# Patient Record
Sex: Female | Born: 1965 | Race: White | Hispanic: No | Marital: Married | Smoking: Current every day smoker
Health system: Southern US, Community
[De-identification: ages and names within clinical notes are randomized; demographics above are authoritative.]

## PROBLEM LIST (undated history)

## (undated) DIAGNOSIS — G5602 Carpal tunnel syndrome, left upper limb: Secondary | ICD-10-CM

## (undated) DIAGNOSIS — M19049 Primary osteoarthritis, unspecified hand: Secondary | ICD-10-CM

## (undated) DIAGNOSIS — F329 Major depressive disorder, single episode, unspecified: Secondary | ICD-10-CM

## (undated) DIAGNOSIS — F32A Depression, unspecified: Secondary | ICD-10-CM

## (undated) HISTORY — PX: STAPEDES SURGERY: SHX789

## (undated) HISTORY — PX: OTHER SURGICAL HISTORY: SHX169

---

## 2000-01-17 ENCOUNTER — Other Ambulatory Visit: Admission: RE | Admit: 2000-01-17 | Discharge: 2000-01-17 | Payer: Self-pay | Admitting: Internal Medicine

## 2008-05-28 ENCOUNTER — Other Ambulatory Visit: Admission: RE | Admit: 2008-05-28 | Discharge: 2008-05-28 | Payer: Self-pay | Admitting: Family Medicine

## 2009-03-24 ENCOUNTER — Encounter: Admission: RE | Admit: 2009-03-24 | Discharge: 2009-03-24 | Payer: Self-pay | Admitting: Family Medicine

## 2009-06-26 ENCOUNTER — Encounter: Admission: RE | Admit: 2009-06-26 | Discharge: 2009-06-26 | Payer: Self-pay | Admitting: Family Medicine

## 2009-08-15 HISTORY — PX: CARPAL TUNNEL RELEASE: SHX101

## 2009-12-06 ENCOUNTER — Emergency Department (HOSPITAL_BASED_OUTPATIENT_CLINIC_OR_DEPARTMENT_OTHER): Admission: EM | Admit: 2009-12-06 | Discharge: 2009-12-06 | Payer: Self-pay | Admitting: Emergency Medicine

## 2009-12-06 ENCOUNTER — Ambulatory Visit: Payer: Self-pay | Admitting: Interventional Radiology

## 2010-11-02 LAB — COMPREHENSIVE METABOLIC PANEL
ALT: 19 U/L (ref 0–35)
Calcium: 9.7 mg/dL (ref 8.4–10.5)
Chloride: 105 mEq/L (ref 96–112)
Glucose, Bld: 127 mg/dL — ABNORMAL HIGH (ref 70–99)
Sodium: 140 mEq/L (ref 135–145)

## 2010-11-02 LAB — CBC
MCHC: 34 g/dL (ref 30.0–36.0)
Platelets: 186 10*3/uL (ref 150–400)

## 2010-11-02 LAB — DIFFERENTIAL
Basophils Absolute: 0 10*3/uL (ref 0.0–0.1)
Basophils Relative: 0 % (ref 0–1)
Eosinophils Relative: 0 % (ref 0–5)
Lymphocytes Relative: 4 % — ABNORMAL LOW (ref 12–46)
Lymphs Abs: 0.7 10*3/uL (ref 0.7–4.0)
Monocytes Absolute: 1 10*3/uL (ref 0.1–1.0)
Neutro Abs: 14.6 10*3/uL — ABNORMAL HIGH (ref 1.7–7.7)
Neutrophils Relative %: 90 % — ABNORMAL HIGH (ref 43–77)

## 2010-11-02 LAB — LIPASE, BLOOD: Lipase: 24 U/L (ref 23–300)

## 2010-11-02 LAB — URINALYSIS, ROUTINE W REFLEX MICROSCOPIC
Bilirubin Urine: NEGATIVE
Ketones, ur: NEGATIVE mg/dL
Nitrite: NEGATIVE
Specific Gravity, Urine: 1.021 (ref 1.005–1.030)

## 2011-07-16 HISTORY — PX: FINGER ARTHROPLASTY: SHX5017

## 2011-11-07 ENCOUNTER — Emergency Department (HOSPITAL_BASED_OUTPATIENT_CLINIC_OR_DEPARTMENT_OTHER)
Admission: EM | Admit: 2011-11-07 | Discharge: 2011-11-07 | Disposition: A | Discharge status: Home / Self Care | Payer: BC Managed Care – PPO | Attending: Emergency Medicine | Admitting: Emergency Medicine

## 2011-11-07 ENCOUNTER — Encounter (HOSPITAL_BASED_OUTPATIENT_CLINIC_OR_DEPARTMENT_OTHER): Payer: Self-pay | Admitting: *Deleted

## 2011-11-07 ENCOUNTER — Emergency Department (INDEPENDENT_AMBULATORY_CARE_PROVIDER_SITE_OTHER): Payer: BC Managed Care – PPO

## 2011-11-07 DIAGNOSIS — S139XXA Sprain of joints and ligaments of unspecified parts of neck, initial encounter: Secondary | ICD-10-CM | POA: Insufficient documentation

## 2011-11-07 DIAGNOSIS — M542 Cervicalgia: Secondary | ICD-10-CM

## 2011-11-07 DIAGNOSIS — F17200 Nicotine dependence, unspecified, uncomplicated: Secondary | ICD-10-CM | POA: Insufficient documentation

## 2011-11-07 DIAGNOSIS — M25519 Pain in unspecified shoulder: Secondary | ICD-10-CM

## 2011-11-07 DIAGNOSIS — S20229A Contusion of unspecified back wall of thorax, initial encounter: Secondary | ICD-10-CM | POA: Insufficient documentation

## 2011-11-07 DIAGNOSIS — M549 Dorsalgia, unspecified: Secondary | ICD-10-CM | POA: Insufficient documentation

## 2011-11-07 DIAGNOSIS — S161XXA Strain of muscle, fascia and tendon at neck level, initial encounter: Secondary | ICD-10-CM

## 2011-11-07 HISTORY — DX: Major depressive disorder, single episode, unspecified: F32.9

## 2011-11-07 HISTORY — DX: Depression, unspecified: F32.A

## 2011-11-07 MED ORDER — CYCLOBENZAPRINE HCL 10 MG PO TABS: 10.0000 mg | ORAL_TABLET | Freq: Two times a day (BID) | ORAL | Status: AC | PRN

## 2011-11-07 MED ORDER — HYDROCODONE-ACETAMINOPHEN 5-325 MG PO TABS: 1.0000 | ORAL_TABLET | ORAL | Status: AC | PRN

## 2011-11-07 NOTE — Discharge Instructions (Signed)
Cervical Strain Care After A cervical strain is when the muscles and ligaments in your neck have been stretched. The bones are not broken. If you had any problems moving your arms or legs immediately after the injury, even if the problem has gone away, make sure to tell this to your caregiver.  HOME CARE INSTRUCTIONS   While awake, apply ice packs to the neck or areas of pain about every 1 to 2 hours, for 15 to 20 minutes at a time. Do this for 2 days. If you were given a cervical collar for support, ask your caregiver if you may remove it for bathing or applying ice.   If given a cervical collar, wear as instructed. Do not remove any collar unless instructed by a caregiver.   Only take over-the-counter or prescription medicines for pain, discomfort, or fever as directed by your caregiver.  Recheck with the hospital or clinic after a radiologist has read your X-rays. Recheck with the hospital or clinic to make sure the initial readings are correct. Do this also to determine if you need further studies. It is your responsibility to find out your X-ray results. X-rays are sometimes repeated in one week to ten days. These are often repeated to make sure that a hairline fracture was not overlooked. Ask your caregiver how you are to find out about your radiology (X-ray) results. SEEK IMMEDIATE MEDICAL CARE IF:   You have increasing pain in your neck.   You develop difficulties swallowing or breathing.   You have numbness, weakness, or movement problems in the arms or legs.   You have difficulty walking.   You develop bowel or bladder retention or incontinence.   You have problems with walking.  MAKE SURE YOU:   Understand these instructions.   Will watch your condition.   Will get help right away if you are not doing well or get worse.  Document Released: 08/01/2005 Document Revised: 04/13/2011 Document Reviewed: 03/14/2008 [facility] Patient Information 2012 [place], [place].  Contusion A  contusion is a deep bruise. Contusions are the result of an injury that caused bleeding under the skin. The contusion may turn blue, purple, or yellow. Minor injuries will give you a painless contusion, but more severe contusions may stay painful and swollen for a few weeks.  CAUSES  A contusion is usually caused by a blow, trauma, or direct force to an area of the body. SYMPTOMS   Swelling and redness of the injured area.   Bruising of the injured area.   Tenderness and soreness of the injured area.   Pain.  DIAGNOSIS  The diagnosis can be made by taking a history and physical exam. An X-ray, CT scan, or MRI may be needed to determine if there were any associated injuries, such as fractures. TREATMENT  Specific treatment will depend on what area of the body was injured. In general, the best treatment for a contusion is resting, icing, elevating, and applying cold compresses to the injured area. Over-the-counter medicines may also be recommended for pain control. Ask your caregiver what the best treatment is for your contusion. HOME CARE INSTRUCTIONS   Put ice on the injured area.   Put ice in a plastic bag.   Place a towel between your skin and the bag.   Leave the ice on for 15 to 20 minutes, 3 to 4 times a day.   Only take over-the-counter or prescription medicines for pain, discomfort, or fever as directed by your caregiver. Your caregiver may  recommend avoiding anti-inflammatory medicines (aspirin, ibuprofen, and naproxen) for 48 hours because these medicines may increase bruising.   Rest the injured area.   If possible, elevate the injured area to reduce swelling.  SEEK IMMEDIATE MEDICAL CARE IF:   You have increased bruising or swelling.   You have pain that is getting worse.   Your swelling or pain is not relieved with medicines.  MAKE SURE YOU:   Understand these instructions.   Will watch your condition.   Will get help right away if you are not doing well or get  worse.  Document Released: 05/11/2005 Document Revised: 07/21/2011 Document Reviewed: 06/06/2011 [facility] Patient Information 2012 [employer].
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
## 2012-06-27 ENCOUNTER — Encounter (HOSPITAL_BASED_OUTPATIENT_CLINIC_OR_DEPARTMENT_OTHER): Payer: Self-pay | Admitting: *Deleted

## 2012-06-27 NOTE — Progress Notes (Signed)
NPO AFTER MN. ARRIVES AT 0645. NEEDS HG AND URINE PREG.

## 2012-07-02 ENCOUNTER — Encounter (HOSPITAL_BASED_OUTPATIENT_CLINIC_OR_DEPARTMENT_OTHER): Payer: Self-pay

## 2012-07-02 ENCOUNTER — Ambulatory Visit (HOSPITAL_BASED_OUTPATIENT_CLINIC_OR_DEPARTMENT_OTHER): Admit: 2012-07-02 | Payer: Self-pay | Admitting: Orthopedic Surgery

## 2012-07-02 ENCOUNTER — Encounter (HOSPITAL_BASED_OUTPATIENT_CLINIC_OR_DEPARTMENT_OTHER)
Admission: RE | Disposition: A | Discharge status: Home / Self Care | Payer: Self-pay | Source: Ambulatory Visit | Attending: Orthopedic Surgery

## 2012-07-02 ENCOUNTER — Encounter (HOSPITAL_BASED_OUTPATIENT_CLINIC_OR_DEPARTMENT_OTHER): Payer: Self-pay | Admitting: Anesthesiology

## 2012-07-02 ENCOUNTER — Ambulatory Visit (HOSPITAL_BASED_OUTPATIENT_CLINIC_OR_DEPARTMENT_OTHER): Payer: BC Managed Care – PPO | Admitting: Anesthesiology

## 2012-07-02 ENCOUNTER — Ambulatory Visit (HOSPITAL_BASED_OUTPATIENT_CLINIC_OR_DEPARTMENT_OTHER)
Admission: RE | Admit: 2012-07-02 | Discharge: 2012-07-02 | Disposition: A | Discharge status: Home / Self Care | Payer: BC Managed Care – PPO | Source: Ambulatory Visit | Attending: Orthopedic Surgery | Admitting: Orthopedic Surgery

## 2012-07-02 ENCOUNTER — Encounter (HOSPITAL_BASED_OUTPATIENT_CLINIC_OR_DEPARTMENT_OTHER): Payer: Self-pay | Admitting: *Deleted

## 2012-07-02 DIAGNOSIS — F3289 Other specified depressive episodes: Secondary | ICD-10-CM | POA: Insufficient documentation

## 2012-07-02 DIAGNOSIS — G5600 Carpal tunnel syndrome, unspecified upper limb: Secondary | ICD-10-CM | POA: Insufficient documentation

## 2012-07-02 DIAGNOSIS — Z79899 Other long term (current) drug therapy: Secondary | ICD-10-CM | POA: Insufficient documentation

## 2012-07-02 DIAGNOSIS — G5630 Lesion of radial nerve, unspecified upper limb: Secondary | ICD-10-CM | POA: Insufficient documentation

## 2012-07-02 DIAGNOSIS — F329 Major depressive disorder, single episode, unspecified: Secondary | ICD-10-CM | POA: Insufficient documentation

## 2012-07-02 DIAGNOSIS — M19049 Primary osteoarthritis, unspecified hand: Secondary | ICD-10-CM | POA: Insufficient documentation

## 2012-07-02 HISTORY — DX: Carpal tunnel syndrome, left upper limb: G56.02

## 2012-07-02 HISTORY — DX: Primary osteoarthritis, unspecified hand: M19.049

## 2012-07-02 HISTORY — PX: FINGER ARTHROPLASTY: SHX5017

## 2012-07-02 HISTORY — PX: CARPAL TUNNEL RELEASE: SHX101

## 2012-07-02 LAB — POCT PREGNANCY, URINE: Preg Test, Ur: NEGATIVE

## 2012-07-02 SURGERY — ARTHROPLASTY, FINGER
Anesthesia: General | Laterality: Left

## 2012-07-02 SURGERY — ARTHROPLASTY, FINGER
Anesthesia: General | Site: Thumb | Laterality: Left | Wound class: Clean

## 2012-07-02 MED ORDER — MIDAZOLAM HCL 5 MG/5ML IJ SOLN
INTRAMUSCULAR | Status: DC | PRN
  Administered 2012-07-02 (×2): 1 mg via INTRAVENOUS

## 2012-07-02 MED ORDER — CEFAZOLIN SODIUM-DEXTROSE 2-3 GM-% IV SOLR
2.0000 g | INTRAVENOUS | Status: AC
  Administered 2012-07-02: 2 g via INTRAVENOUS

## 2012-07-02 MED ORDER — PROMETHAZINE HCL 25 MG/ML IJ SOLN
6.2500 mg | INTRAMUSCULAR | Status: DC | PRN
  Administered 2012-07-02: 6.25 mg via INTRAVENOUS

## 2012-07-02 MED ORDER — BUPIVACAINE-EPINEPHRINE 0.5% -1:200000 IJ SOLN
INTRAMUSCULAR | Status: DC | PRN
  Administered 2012-07-02: 20 mL

## 2012-07-02 MED ORDER — HYDROMORPHONE HCL PF 1 MG/ML IJ SOLN
0.5000 mg | INTRAMUSCULAR | Status: DC | PRN
  Filled 2012-07-02: qty 1

## 2012-07-02 MED ORDER — PROPOFOL 10 MG/ML IV BOLUS
INTRAVENOUS | Status: DC | PRN
  Administered 2012-07-02: 100 mg via INTRAVENOUS
  Administered 2012-07-02: 200 mg via INTRAVENOUS

## 2012-07-02 MED ORDER — LACTATED RINGERS IV SOLN
INTRAVENOUS | Status: DC
  Administered 2012-07-02 (×2): via INTRAVENOUS
  Filled 2012-07-02: qty 1000

## 2012-07-02 MED ORDER — OXYCODONE-ACETAMINOPHEN 5-325 MG PO TABS
1.0000 | ORAL_TABLET | ORAL | Status: DC | PRN
  Administered 2012-07-02: 1 via ORAL
  Filled 2012-07-02: qty 2

## 2012-07-02 MED ORDER — HYDROCODONE-ACETAMINOPHEN 5-325 MG PO TABS
1.0000 | ORAL_TABLET | ORAL | Status: DC | PRN
  Filled 2012-07-02: qty 2

## 2012-07-02 MED ORDER — LIDOCAINE HCL (CARDIAC) 20 MG/ML IV SOLN
INTRAVENOUS | Status: DC | PRN
  Administered 2012-07-02: 80 mg via INTRAVENOUS

## 2012-07-02 MED ORDER — FENTANYL CITRATE 0.05 MG/ML IJ SOLN
INTRAMUSCULAR | Status: DC | PRN
  Administered 2012-07-02: 50 ug via INTRAVENOUS
  Administered 2012-07-02: 25 ug via INTRAVENOUS
  Administered 2012-07-02 (×5): 50 ug via INTRAVENOUS
  Administered 2012-07-02: 25 ug via INTRAVENOUS
  Administered 2012-07-02: 50 ug via INTRAVENOUS

## 2012-07-02 MED ORDER — LACTATED RINGERS IV SOLN: INTRAVENOUS | Status: DC

## 2012-07-02 MED ORDER — ACETAMINOPHEN 10 MG/ML IV SOLN
INTRAVENOUS | Status: DC | PRN
  Administered 2012-07-02: 1000 mg via INTRAVENOUS

## 2012-07-02 MED ORDER — KETOROLAC TROMETHAMINE 30 MG/ML IJ SOLN
INTRAMUSCULAR | Status: DC | PRN
  Administered 2012-07-02: 30 mg via INTRAVENOUS

## 2012-07-02 MED ORDER — DEXAMETHASONE SODIUM PHOSPHATE 4 MG/ML IJ SOLN
INTRAMUSCULAR | Status: DC | PRN
  Administered 2012-07-02: 10 mg via INTRAVENOUS

## 2012-07-02 MED ORDER — HYDROMORPHONE HCL PF 1 MG/ML IJ SOLN
0.2500 mg | INTRAMUSCULAR | Status: DC | PRN
  Administered 2012-07-02 (×2): 0.5 mg via INTRAVENOUS

## 2012-07-02 MED ORDER — ONDANSETRON HCL 4 MG/2ML IJ SOLN
INTRAMUSCULAR | Status: DC | PRN
  Administered 2012-07-02: 4 mg via INTRAVENOUS

## 2012-07-02 SURGICAL SUPPLY — 72 items
BANDAGE GAUZE ELAST BULKY 4 IN (GAUZE/BANDAGES/DRESSINGS) ×6 IMPLANT
BLADE AVERAGE 25X9 (BLADE) ×3 IMPLANT
BLADE MINI RND TIP GREEN BEAV (BLADE) ×6 IMPLANT
BLADE SURG 15 STRL LF DISP TIS (BLADE) ×2 IMPLANT
BLADE SURG 15 STRL SS (BLADE) ×1
BNDG COHESIVE 3X5 TAN STRL LF (GAUZE/BANDAGES/DRESSINGS) IMPLANT
BNDG COHESIVE 4X5 TAN NS LF (GAUZE/BANDAGES/DRESSINGS) ×3 IMPLANT
BNDG ESMARK 4X9 LF (GAUZE/BANDAGES/DRESSINGS) ×3 IMPLANT
CANISTER SUCTION 1200CC (MISCELLANEOUS) IMPLANT
CANISTER SUCTION 2500CC (MISCELLANEOUS) IMPLANT
CHLORAPREP W/TINT 26ML (MISCELLANEOUS) ×3 IMPLANT
CLOTH BEACON ORANGE TIMEOUT ST (SAFETY) ×3 IMPLANT
CORDS BIPOLAR (ELECTRODE) ×3 IMPLANT
COVER MAYO STAND STRL (DRAPES) IMPLANT
COVER TABLE BACK 60X90 (DRAPES) ×3 IMPLANT
DRAPE EXTREMITY T 121X128X90 (DRAPE) ×3 IMPLANT
DRAPE SURG 17X23 STRL (DRAPES) ×3 IMPLANT
DRSG EMULSION OIL 3X3 NADH (GAUZE/BANDAGES/DRESSINGS) ×3 IMPLANT
ELECT NEEDLE BLADE 2-5/6 (NEEDLE) IMPLANT
GAUZE SPONGE 4X4 12PLY STRL LF (GAUZE/BANDAGES/DRESSINGS) ×3 IMPLANT
GLOVE BIO SURGEON STRL SZ 6.5 (GLOVE) ×3 IMPLANT
GLOVE BIO SURGEON STRL SZ7.5 (GLOVE) ×3 IMPLANT
GLOVE BIOGEL PI IND STRL 8 (GLOVE) ×2 IMPLANT
GLOVE BIOGEL PI INDICATOR 8 (GLOVE) ×1
GLOVE ECLIPSE 6.0 STRL STRAW (GLOVE) ×3 IMPLANT
GLOVE INDICATOR 8.0 STRL GRN (GLOVE) ×6 IMPLANT
GOWN PREVENTION PLUS LG XLONG (DISPOSABLE) ×3 IMPLANT
GOWN PREVENTION PLUS XLARGE (GOWN DISPOSABLE) ×3 IMPLANT
INSTRUMENT SOFT TISSUE RELEASE (INSTRUMENTS) ×3 IMPLANT
KWIRE 4.0 X .062IN (WIRE) ×3 IMPLANT
LOOP VESSEL MAXI BLUE (MISCELLANEOUS) IMPLANT
LOOP VESSEL MINI RED (MISCELLANEOUS) IMPLANT
NEEDLE HYPO 22GX1.5 SAFETY (NEEDLE) IMPLANT
NEEDLE HYPO 25X1 1.5 SAFETY (NEEDLE) IMPLANT
NS IRRIG 500ML POUR BTL (IV SOLUTION) ×3 IMPLANT
PACK BASIN DAY SURGERY FS (CUSTOM PROCEDURE TRAY) ×3 IMPLANT
PAD CAST 3X4 CTTN HI CHSV (CAST SUPPLIES) ×2 IMPLANT
PAD CAST 4YDX4 CTTN HI CHSV (CAST SUPPLIES) ×2 IMPLANT
PADDING CAST ABS 3INX4YD NS (CAST SUPPLIES)
PADDING CAST ABS 4INX4YD NS (CAST SUPPLIES) ×1
PADDING CAST ABS COTTON 3X4 (CAST SUPPLIES) IMPLANT
PADDING CAST ABS COTTON 4X4 ST (CAST SUPPLIES) ×2 IMPLANT
PADDING CAST COTTON 3X4 STRL (CAST SUPPLIES) ×1
PADDING CAST COTTON 4X4 STRL (CAST SUPPLIES) ×1
SLEEVE SCD COMPRESS KNEE MED (MISCELLANEOUS) IMPLANT
SLING ULTRA II MEDIUM (SOFTGOODS) ×3 IMPLANT
SPLINT FAST PLASTER 5X30 (CAST SUPPLIES) ×1
SPLINT PLASTER CAST FAST 5X30 (CAST SUPPLIES) ×2 IMPLANT
SPLINT PLASTER CAST XFAST 3X15 (CAST SUPPLIES) IMPLANT
SPLINT PLASTER CAST XFAST 4X15 (CAST SUPPLIES) IMPLANT
SPLINT PLASTER XTRA FAST SET 4 (CAST SUPPLIES)
SPLINT PLASTER XTRA FASTSET 3X (CAST SUPPLIES)
SPONGE GAUZE 4X4 12PLY (GAUZE/BANDAGES/DRESSINGS) ×3 IMPLANT
SPONGE LAP 4X18 X RAY DECT (DISPOSABLE) ×6 IMPLANT
STOCKINETTE 4X48 STRL (DRAPES) ×3 IMPLANT
STRIP CLOSURE SKIN 1/2X4 (GAUZE/BANDAGES/DRESSINGS) ×3 IMPLANT
SUT ETHIBOND 3-0 V-5 (SUTURE) ×3 IMPLANT
SUT MERSILENE 4 0 P 3 (SUTURE) IMPLANT
SUT PDS AB 2-0 CT2 27 (SUTURE) IMPLANT
SUT STEEL 4 0 (SUTURE) IMPLANT
SUT VIC AB 2-0 PS2 27 (SUTURE) IMPLANT
SUT VICRYL 4-0 PS2 18IN ABS (SUTURE) IMPLANT
SUT VICRYL RAPID 5 0 P 3 (SUTURE) IMPLANT
SUT VICRYL RAPIDE 4/0 PS 2 (SUTURE) ×6 IMPLANT
SYR BULB 3OZ (MISCELLANEOUS) IMPLANT
SYR CONTROL 10ML LL (SYRINGE) IMPLANT
SYRINGE 10CC LL (SYRINGE) ×3 IMPLANT
TOWEL OR 17X24 6PK STRL BLUE (TOWEL DISPOSABLE) ×3 IMPLANT
TOWEL OR NON WOVEN STRL DISP B (DISPOSABLE) ×3 IMPLANT
TUBE CONNECTING 12X1/4 (SUCTIONS) IMPLANT
UNDERPAD 30X30 INCONTINENT (UNDERPADS AND DIAPERS) ×3 IMPLANT
WATER STERILE IRR 500ML POUR (IV SOLUTION) ×3 IMPLANT

## 2012-07-02 NOTE — H&P (Signed)
Kelly Marshall is an 46 y.o. female.   Chief Complaint: Left forearm/hand pain and numbness  HPI: This patient has previously undergone similar R sided procedures, and now has significant symptoms on the Left side.  She has responded transiently to non-operative measures, but would like to proceed with more surgery for more enduring relief of symptoms.  She has had NCS indicating CTS on the left  Past Medical History  Diagnosis Date  . Depression   . OA (osteoarthritis) of finger LEFT THUMB  . Carpal tunnel syndrome of left wrist     Past Surgical History  Procedure Date  . Arm surgery   . Carpal tunnel release JAN 2011    RIGHT  . Finger arthroplasty DEC 2012    RIGHT THUMB  . Stamin   . Stamen   . Staminectomy   . Stapedes surgery 1990  (APPROX)    BILATERAL    History reviewed. No pertinent family history. Social History:  reports that she has been smoking Cigarettes.  She has a 5 pack-year smoking history. She has never used smokeless tobacco. She reports that she does not drink alcohol or use illicit drugs.  Allergies: No Known Allergies  Medications Prior to Admission  Medication Sig Dispense Refill  . Citalopram Hydrobromide (CELEXA PO) Take 40 mg by mouth daily.       . Oxycodone-Acetaminophen (PERCOCET PO) Take 5-325 mg by mouth as needed.         No results found for this or any previous visit (from the past 48 hour(s)). No results found.  Review of Systems  All other systems reviewed and are negative.    Blood pressure 121/79, pulse 85, temperature 98.1 F (36.7 C), temperature source Oral, resp. rate 18, height 5\' 6"  (1.676 m), weight 67.728 kg (149 lb 5 oz), last menstrual period 05/16/2012, SpO2 98.00%. Physical Exam  Constitutional: She is oriented to person, place, and time. She appears well-developed and well-nourished.  HENT:  Head: Normocephalic and atraumatic.  Eyes: Conjunctivae normal and EOM are normal.  Neck: Normal range of motion.    Cardiovascular: Intact distal pulses.   Respiratory: Effort normal.  GI: Soft.  Neurological: She is alert and oriented to person, place, and time.  Skin: Skin is warm and dry.  Psychiatric: She has a normal mood and affect. Her behavior is normal. Judgment and thought content normal.    Left thumb very tender at [facility] joint, increased pain with TMC stress/grind.  TTP over radial nerve at radial tunnel, but not at lateral epicondyle.  Positive CCT/Phalens.  Assessment/Plan L TMC OA, CTS, Radial tunnel syndrome  Surgery as described as outpt at [facility].  Reviewed G/R/B.  Consent obtained.  Anum Palecek A. 07/02/2012, 8:49 AM

## 2012-07-02 NOTE — Anesthesia Preprocedure Evaluation (Addendum)
Anesthesia Evaluation  Patient identified by MRN, date of birth, ID band Patient awake    Reviewed: Allergy & Precautions, H&P , NPO status , Patient's Chart, lab work & pertinent test results  Airway       Dental   Pulmonary Current Smoker,          Cardiovascular negative cardio ROS      Neuro/Psych Depression  Neuromuscular disease negative psych ROS   GI/Hepatic negative GI ROS, Neg liver ROS,   Endo/Other  negative endocrine ROS  Renal/GU negative Renal ROS  negative genitourinary   Musculoskeletal negative musculoskeletal ROS (+)   Abdominal   Peds  Hematology negative hematology ROS (+)   Anesthesia Other Findings   Reproductive/Obstetrics negative OB ROS                           Anesthesia Physical Anesthesia Plan  ASA: II  Anesthesia Plan:    Post-op Pain Management:    Induction:   Airway Management Planned:   Additional Equipment:   Intra-op Plan:   Post-operative Plan:   Informed Consent:   Plan Discussed with:   Anesthesia Plan Comments:         Anesthesia Quick Evaluation

## 2012-07-02 NOTE — Transfer of Care (Signed)
Immediate Anesthesia Transfer of Care Note  Patient: Kelly Marshall  Procedure(s) Performed: Procedure(s) (LRB): FINGER ARTHROPLASTY (Left) CARPAL TUNNEL RELEASE ENDOSCOPIC (Left)  Patient Location: PACU  Anesthesia Type: General  Level of Consciousness: awake, alert  and oriented  Airway & Oxygen Therapy: Patient Spontanous Breathing and Patient connected to face mask oxygen  Post-op Assessment: Report given to PACU RN and Post -op Vital signs reviewed and stable  Post vital signs: Reviewed and stable  Complications: No apparent anesthesia complications

## 2012-07-02 NOTE — Op Note (Signed)
07/02/2012  11:22 AM  PATIENT:  Kelly Marshall  46 y.o. female  PRE-OPERATIVE DIAGNOSIS:  left thumb TMC Osteoarthritis/Carpal tunnel Syndrome/Radial Tunnel Syndrome  POST-OPERATIVE DIAGNOSIS:  Same  PROCEDURE: Left thumb suspension arthroplasty (LRTI), ECTR, Radial Neuroplasty  SURGEON:  Surgeon(s): Jodi Marble, MD  PHYSICIAN ASSISTANT: None  ANESTHESIA:  General  SPECIMENS:  None  DRAINS:   None  PREOPERATIVE INDICATIONS:  [name] is a  46 y.o. female with a diagnosis of left thumb TMC Osteoarthritis/Carpal tunnel Syndrome/Radial Tunnel Syndrome who failed conservative measures and elected for surgical management.    The risks benefits and alternatives were discussed with the patient preoperatively including but not limited to the risks of infection, bleeding, nerve injury, cardiopulmonary complications, the need for revision surgery, among others, and the patient verbalized understanding and consented to proceed.  OPERATIVE IMPLANTS: None  OPERATIVE FINDINGS: Tight carpal tunnel, acceptably decompressed following release; point of compression of radial nerve appeared to be at leading edge of supinator; grade 4 DJD of TMCJ  OPERATIVE PROCEDURE:  After receiving prophylactic antibiotics, the patient was escorted to the operative theatre and placed in a supine position.  A surgical "time-out" was performed during which the planned procedure, proposed operative site, and the correct patient identity were compared to the operative consent and agreement confirmed by the circulating nurse according to current facility policy.  Following application of a tourniquet to the operative extremity, the planned incision was marked and anesthetized with a mixture of lidocaine & bupivicaine containing epinephrine.  The exposed skin was prepped with Chloraprep and draped in the usual sterile fashion.  The limb was exsanguinated with an Esmarch bandage and the tourniquet  inflated to approximately higher than systolic BP.   The incision was made sharply with a  scalpel.  Subcutaneous tissues were dissected with blunt spreading dissection. The deep forearm fascia was then identified and split transversely to reveal the underlying median nerve. The distal edge of the fascia was grasped with a hemostat. The canal was then sequentially dilated and the undersurface of the transverse carpal ligament cleaned free of synovium. The centerline system from [employer] was then used to evaluate and divide the transverse carpal ligament beginning at the distal edge working proximally. The adequacy of the release was then judged with direct visualization using loupe assisted magnification. The deep forearm fascia was then split for a couple inches proximal to the proximal incision using a sliding scissor technique.   Attention was then directed to performing the radial neuroplasty. A slightly oblique incision 4-5 inches in length was made over the radial tunnel sharply with a scalpel. Subcutaneous tissues were dissected with blunt spreading dissection. The interval between the brachial radialis and ECRL was exploited and blunt digital dissection revealed the radial nerve. It was carefully decompressed to include both superficial and deep branch. The deep branch was found to enter the supinator and at that point was narrowed and irritable to manipulation. Just proximal to it was slightly enlarged. It was followed into the supinator for about an inch and a half and then no further portion of the supinator was split. Attention was then directed to the thumb suspension arthroplasty.  A Wagner incision was marked at the thumb as was an oblique incision over the FCR just distal to the muscle tendon junction. TMC joint was then made sharply with a scalpel. Subcutaneous tissues were dissected with blunt, spreading dissection with care to identify and protect and preserve the cutaneous nerve  branches  that were retracted and the dorsal flap. The thenar muscles were reflected extra periosteally and then the longitudinal deep incision was made at the palmar edge of the abductor tendon, allowing some of the volar portion to go with the volar capsular reflection and the remainder with a dorsal capsular reflection. The [facility] joint was inspected and found to be badly arthritic. The trapezium was excised piecemeal. The scaphotrapezoid joint was inspected and found to be acceptable. The base of the metacarpal was resected with an oscillating saw and then the hole was made in the trapezium to accept the suspension. It was made with a 0.062 inch K wire first, followed by a 2.7 then 3.5 mm drill bit. A longitudinal half strip of the FCR was then harvested making an accessory incision more proximally. It was brought into the wound where it was then used to create a suspension for the metacarpal of the thumb, first bringing it through the tunnel that had been prepared, then passing it back down through the capsule between APL and EPB tendons. It was tied in a half hitch upon itself and this was secured with 3-0 Ethibond suture. Everything was again copiously irrigated and the remainder of the tendon fanfolded and secured with additional 3-0 suture that had been placed into the capsule and the depth of the wound. This allowed for retention of the interposition material within the resection space.  The capsule at the [facility], The joint was closed with 3-0 Ethibond suture.The tourniquet was released and hemostasis obtained. Wounds were again copiously irrigated.  The proximal wound was closed in layers with 3-0 Vicryl interrupted subcuticular sutures and a running subcuticular 4-0 Vicryl suture followed by benzoin and Steri-Strips. The midforearm incision used to harvest the FCR tendon was closed with running 4-0 Vicryl Rapide sutures and followed by benzoin and Steri-Strips. Carpal tunnel release incision was closed in the same  fashion. The incision at the thumb was closed with interrupted horizontal mattress sutures of 4-0 Vicryl Rapide. A bulky hand dressing/splint with a thumb spica plaster component was applied after half percent Marcaine with epinephrine was instilled in the regions of the proximal incisions as well as subcutaneously volarly and radially proximal to the wrist.  DISPOSITION: Will be discharged home today with typical postop instructions, returning in 10-15 days for reassessment. At that time her splint dressing should be removed and she will transition directly to hand therapy for fabrication of a custom forearm-based thumb spica splint and initiation of rehabilitation. No x-rays are required. She may return to work on 07/16/12 with restrictions as detailed in her discharge instructions.     The tourniquet was released, the wound copiously irrigated, and the skin closed with 4-0 Vicryl Rapide interrupted buried sutures. A bulky dressing was applied and the patient was taken to the recovery room in stable condition.  DISPOSITION:  The patient will be discharged home today, returning in 7-10 days for re-assessment.

## 2012-07-02 NOTE — Anesthesia Procedure Notes (Signed)
Procedure Name: LMA Insertion Date/Time: 07/02/2012 9:29 AM Performed by: Norva Pavlov Pre-anesthesia Checklist: Patient identified, Emergency Drugs available, Suction available and Patient being monitored Patient Re-evaluated:Patient Re-evaluated prior to inductionOxygen Delivery Method: Circle System Utilized Preoxygenation: Pre-oxygenation with 100% oxygen Intubation Type: IV induction Ventilation: Mask ventilation without difficulty LMA: LMA inserted LMA Size: 4.0 Number of attempts: 1 Airway Equipment and Method: bite block Placement Confirmation: positive ETCO2 Tube secured with: Tape Dental Injury: Teeth and Oropharynx as per pre-operative assessment

## 2012-07-03 ENCOUNTER — Encounter (HOSPITAL_BASED_OUTPATIENT_CLINIC_OR_DEPARTMENT_OTHER): Payer: Self-pay | Admitting: Orthopedic Surgery

## 2012-07-03 LAB — POCT HEMOGLOBIN-HEMACUE: Hemoglobin: 15.4 g/dL — ABNORMAL HIGH (ref 12.0–15.0)

## 2012-07-03 NOTE — Anesthesia Postprocedure Evaluation (Signed)
Anesthesia Post Note  Patient: Kelly Marshall  Procedure(s) Performed: Procedure(s) (LRB): FINGER ARTHROPLASTY (Left) CARPAL TUNNEL RELEASE ENDOSCOPIC (Left)  Anesthesia type: General  Patient location: PACU  Post pain: Pain level controlled  Post assessment: Post-op Vital signs reviewed  Last Vitals:  Filed Vitals:   07/02/12 1325  BP: 119/80  Pulse: 75  Temp: 36 C  Resp: 18    Post vital signs: Reviewed  Level of consciousness: sedated  Complications: No apparent anesthesia complications

## 2012-09-13 ENCOUNTER — Other Ambulatory Visit (HOSPITAL_COMMUNITY)
Admission: RE | Admit: 2012-09-13 | Discharge: 2012-09-13 | Disposition: A | Discharge status: Home / Self Care | Payer: BC Managed Care – PPO | Source: Ambulatory Visit | Attending: Family Medicine | Admitting: Family Medicine

## 2012-09-13 ENCOUNTER — Other Ambulatory Visit: Payer: Self-pay | Admitting: Family Medicine

## 2012-09-13 DIAGNOSIS — Z124 Encounter for screening for malignant neoplasm of cervix: Secondary | ICD-10-CM | POA: Insufficient documentation

## 2014-12-18 ENCOUNTER — Emergency Department (HOSPITAL_BASED_OUTPATIENT_CLINIC_OR_DEPARTMENT_OTHER): Payer: 59

## 2014-12-18 ENCOUNTER — Emergency Department (HOSPITAL_BASED_OUTPATIENT_CLINIC_OR_DEPARTMENT_OTHER)
Admission: EM | Admit: 2014-12-18 | Discharge: 2014-12-18 | Disposition: A | Discharge status: Home / Self Care | Payer: 59 | Attending: Emergency Medicine | Admitting: Emergency Medicine

## 2014-12-18 ENCOUNTER — Encounter (HOSPITAL_BASED_OUTPATIENT_CLINIC_OR_DEPARTMENT_OTHER): Payer: Self-pay | Admitting: *Deleted

## 2014-12-18 DIAGNOSIS — S5001XA Contusion of right elbow, initial encounter: Secondary | ICD-10-CM | POA: Diagnosis not present

## 2014-12-18 DIAGNOSIS — F329 Major depressive disorder, single episode, unspecified: Secondary | ICD-10-CM | POA: Diagnosis not present

## 2014-12-18 DIAGNOSIS — W19XXXA Unspecified fall, initial encounter: Secondary | ICD-10-CM

## 2014-12-18 DIAGNOSIS — W010XXA Fall on same level from slipping, tripping and stumbling without subsequent striking against object, initial encounter: Secondary | ICD-10-CM | POA: Diagnosis not present

## 2014-12-18 DIAGNOSIS — Z79899 Other long term (current) drug therapy: Secondary | ICD-10-CM | POA: Insufficient documentation

## 2014-12-18 DIAGNOSIS — Z720 Tobacco use: Secondary | ICD-10-CM | POA: Diagnosis not present

## 2014-12-18 DIAGNOSIS — S60221A Contusion of right hand, initial encounter: Secondary | ICD-10-CM | POA: Diagnosis not present

## 2014-12-18 DIAGNOSIS — S4991XA Unspecified injury of right shoulder and upper arm, initial encounter: Secondary | ICD-10-CM | POA: Insufficient documentation

## 2014-12-18 DIAGNOSIS — M25511 Pain in right shoulder: Secondary | ICD-10-CM

## 2014-12-18 DIAGNOSIS — Y9289 Other specified places as the place of occurrence of the external cause: Secondary | ICD-10-CM | POA: Insufficient documentation

## 2014-12-18 DIAGNOSIS — M1990 Unspecified osteoarthritis, unspecified site: Secondary | ICD-10-CM | POA: Insufficient documentation

## 2014-12-18 DIAGNOSIS — Y998 Other external cause status: Secondary | ICD-10-CM | POA: Insufficient documentation

## 2014-12-18 DIAGNOSIS — Y9389 Activity, other specified: Secondary | ICD-10-CM | POA: Insufficient documentation

## 2014-12-18 MED ORDER — NAPROXEN 500 MG PO TABS: 500.0000 mg | ORAL_TABLET | Freq: Two times a day (BID) | ORAL | Status: DC

## 2014-12-18 NOTE — ED Provider Notes (Signed)
CSN: [id number]     Arrival date & time 12/18/14  1604 History   First MD Initiated Contact with Patient 12/18/14 1730     Chief Complaint  Patient presents with  . Fall     (Consider location/radiation/quality/duration/timing/severity/associated sxs/prior Treatment) Patient is a 49 y.o. female presenting with fall. The history is provided by the patient.  Fall Pertinent negatives include no chest pain, no abdominal pain, no headaches and no shortness of breath.   patient status post fall yesterday 7:30 in the morning. Tripped up on dog's leash and fell from standing position. Patient with complaint of right shoulder elbow and hand pain. Rates this to be 7 out of 10. There is some redness to the elbow that is swollen and there is some redness to the ulnar side of the hand with swelling. Denies any other injuries no loss of consciousness. No neck pain no back pain no abdominal pain no lower extremity pain. No headache.  Past Medical History  Diagnosis Date  . Depression   . OA (osteoarthritis) of finger LEFT THUMB  . Carpal tunnel syndrome of left wrist    Past Surgical History  Procedure Laterality Date  . Arm surgery    . Carpal tunnel release  JAN 2011    RIGHT  . Finger arthroplasty  DEC 2012    RIGHT THUMB  . Stamin    . Stamen    . Staminectomy    . Stapedes surgery  1990  (APPROX)    BILATERAL  . Finger arthroplasty  07/02/2012    Procedure: FINGER ARTHROPLASTY;  Surgeon: Jodi Marble, MD;  Location: [facility];  Service: Orthopedics;  Laterality: Left;  Left Thumb Suspension Arthroplasty  . Carpal tunnel release  07/02/2012    Procedure: CARPAL TUNNEL RELEASE ENDOSCOPIC;  Surgeon: Jodi Marble, MD;  Location: [facility];  Service: Orthopedics;  Laterality: Left;  Open radial tunnel release   No family history on file. History  Substance Use Topics  . Smoking status: Current Every Day Smoker -- 0.50 packs/day for 10 years     Types: Cigarettes  . Smokeless tobacco: Never Used  . Alcohol Use: No   OB History    No data available     Review of Systems  Constitutional: Negative for fever.  HENT: Negative for congestion.   Eyes: Negative for visual disturbance.  Respiratory: Negative for shortness of breath.   Cardiovascular: Negative for chest pain.  Gastrointestinal: Negative for abdominal pain.  Genitourinary: Negative for hematuria.  Musculoskeletal: Negative for back pain and neck pain.  Skin: Negative for wound.  Neurological: Negative for headaches.  Hematological: Does not bruise/bleed easily.  Psychiatric/Behavioral: Negative for confusion.      Allergies  Review of patient's allergies indicates no known allergies.  Home Medications   Prior to Admission medications   Medication Sig Start Date End Date Taking? Authorizing Provider  Citalopram Hydrobromide (CELEXA PO) Take 40 mg by mouth daily.     Historical Provider, MD  naproxen (NAPROSYN) 500 MG tablet Take 1 tablet (500 mg total) by mouth 2 (two) times daily. 12/18/14   Vanetta Mulders, MD  Oxycodone-Acetaminophen (PERCOCET PO) Take 5-325 mg by mouth as needed.     Historical Provider, MD   BP 127/78 mmHg  Pulse 82  Temp(Src) 98.1 F (36.7 C) (Oral)  Resp 20  Ht  (1.676 m)  Wt 140 lb (63.504 kg)  BMI 22.61 kg/m2  SpO2 100% Physical Exam  Constitutional:  She is oriented to person, place, and time. She appears well-developed and well-nourished. No distress.  HENT:  Head: Normocephalic and atraumatic.  Mouth/Throat: Oropharynx is clear and moist.  Eyes: Conjunctivae and EOM are normal. Pupils are equal, round, and reactive to light.  Neck: Normal range of motion. Neck supple.  Cardiovascular: Normal rate, regular rhythm and normal heart sounds.   No murmur heard. Pulmonary/Chest: Effort normal and breath sounds normal. No respiratory distress.  Abdominal: Soft. Bowel sounds are normal. There is no tenderness.   Musculoskeletal: Normal range of motion. She exhibits edema and tenderness.  Normal except for some pain with range of motion of the right shoulder no obvious deformity. Swelling posteriorly and redness to the right elbow. And right hand ulnar side with redness and swelling. No snuffbox tenderness. Cap refill is 1 second sensation intact. Radial pulses 2+. Range of motion of the fingers present but with some discomfort also range of motion of the elbow with some discomfort.  Neurological: She is alert and oriented to person, place, and time. No cranial nerve deficit. She exhibits normal muscle tone. Coordination normal.  Skin: Skin is warm. There is erythema.  Nursing note and vitals reviewed.   ED Course  Procedures (including critical care time) Labs Review Labs Reviewed - No data to display  Imaging Review Dg Shoulder Right  12/18/2014   CLINICAL DATA:  Status post fall today with generalized right shoulder pain, no history of previous injury.  EXAM: RIGHT SHOULDER - 2+ VIEW  COMPARISON:  Chest x-ray of November 07, 2011 which included a small portion of the shoulder.  FINDINGS: The bones of the shoulder are adequately mineralized. The glenohumeral and AC joints are intact. There is no acute fracture nor dislocation. The observed portions of the left clavicle and upper left ribs are normal.  IMPRESSION: There is no acute bony abnormality of the right shoulder.   Electronically Signed   By: David  SwazilandJordan M.D.   On: 12/18/2014 17:04   Dg Elbow Complete Right  12/18/2014   CLINICAL DATA:  Fall today, right elbow pain and bruising, swelling  EXAM: RIGHT ELBOW - COMPLETE 3+ VIEW  COMPARISON:  None.  FINDINGS: Four views of the right elbow submitted. No acute fracture or subluxation. Soft tissue swelling noted dorsal elbow. No posterior fat pad sign.  IMPRESSION: No acute fracture or subluxation. No posterior fat pad sign. Dorsal soft tissue swelling.   Electronically Signed   By: Natasha MeadLiviu  Pop M.D.   On:  12/18/2014 17:06   Dg Hand Complete Right  12/18/2014   CLINICAL DATA:  Fall today, right fifth metacarpal pain  EXAM: RIGHT HAND - COMPLETE 3+ VIEW  COMPARISON:  None.  FINDINGS: Three views of the right hand submitted. No acute fracture or subluxation. Degenerative changes are noted first carpometacarpal joint. Mild narrowing of radiocarpal joint space. There are cystic and erosive changes anterior aspect of navicular.  IMPRESSION: No acute fracture or subluxation. Degenerative changes as described above.   Electronically Signed   By: Natasha MeadLiviu  Pop M.D.   On: 12/18/2014 17:07     EKG Interpretation None      MDM   Final diagnoses:  Fall, initial encounter  Elbow contusion, right, initial encounter  Shoulder pain, acute, right  Hand contusion, right, initial encounter   Patient status post fall yesterday get wrapped up in her dogs leash and fell down from standing position. Complain of pain to right shoulder right elbow and right hand. Has swelling and redness  to the ulnar aspect of the right hand. Has redness and swelling to the posterior aspect of the elbow. No obvious deformity or redness or obvious injury to the right shoulder. Offered patient sling but states she has one at home. Will treat with Naprosyn. X-rays are negative for any bony injuries. No snuffbox tenderness to the right hand. Neurovascularly everything intact.    Vanetta MuldersScott Tory Septer, MD 12/18/14 [phone]

## 2014-12-18 NOTE — ED Notes (Signed)
Tripped over a dog leash yesterday and fell. Injury to her right elbow. Right shoulder and hand pain. Elbow is red and swollen. Swelling to her shoulder and redness to her hand.

## 2014-12-18 NOTE — Discharge Instructions (Signed)
X-rays of the right hand elbow and shoulder without any bony abnormalities. Take the Naprosyn as directed. Follow-up of not improving and 1-2 weeks. Return for any new or worse symptoms.

## 2018-05-31 ENCOUNTER — Encounter (HOSPITAL_BASED_OUTPATIENT_CLINIC_OR_DEPARTMENT_OTHER): Payer: Self-pay | Admitting: *Deleted

## 2018-05-31 ENCOUNTER — Emergency Department (HOSPITAL_BASED_OUTPATIENT_CLINIC_OR_DEPARTMENT_OTHER)
Admission: EM | Admit: 2018-05-31 | Discharge: 2018-05-31 | Disposition: A | Discharge status: Home / Self Care | Payer: 59 | Attending: Emergency Medicine | Admitting: Emergency Medicine

## 2018-05-31 ENCOUNTER — Other Ambulatory Visit: Payer: Self-pay

## 2018-05-31 ENCOUNTER — Emergency Department (HOSPITAL_BASED_OUTPATIENT_CLINIC_OR_DEPARTMENT_OTHER): Payer: 59

## 2018-05-31 DIAGNOSIS — Y9389 Activity, other specified: Secondary | ICD-10-CM | POA: Diagnosis not present

## 2018-05-31 DIAGNOSIS — Y998 Other external cause status: Secondary | ICD-10-CM | POA: Diagnosis not present

## 2018-05-31 DIAGNOSIS — F329 Major depressive disorder, single episode, unspecified: Secondary | ICD-10-CM | POA: Insufficient documentation

## 2018-05-31 DIAGNOSIS — S59912A Unspecified injury of left forearm, initial encounter: Secondary | ICD-10-CM | POA: Insufficient documentation

## 2018-05-31 DIAGNOSIS — Y929 Unspecified place or not applicable: Secondary | ICD-10-CM | POA: Insufficient documentation

## 2018-05-31 DIAGNOSIS — F17210 Nicotine dependence, cigarettes, uncomplicated: Secondary | ICD-10-CM | POA: Insufficient documentation

## 2018-05-31 DIAGNOSIS — T1490XA Injury, unspecified, initial encounter: Secondary | ICD-10-CM

## 2018-05-31 MED ORDER — HYDROCODONE-ACETAMINOPHEN 5-325 MG PO TABS: 1.0000 | ORAL_TABLET | Freq: Once | ORAL | Status: DC
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
## 2019-03-15 ENCOUNTER — Other Ambulatory Visit: Payer: Self-pay

## 2019-10-07 ENCOUNTER — Other Ambulatory Visit: Payer: Self-pay | Admitting: Family Medicine

## 2019-10-07 ENCOUNTER — Other Ambulatory Visit (HOSPITAL_COMMUNITY)
Admission: RE | Admit: 2019-10-07 | Discharge: 2019-10-07 | Disposition: A | Discharge status: Home / Self Care | Payer: 59 | Source: Ambulatory Visit | Attending: Family Medicine | Admitting: Family Medicine

## 2019-10-07 DIAGNOSIS — Z124 Encounter for screening for malignant neoplasm of cervix: Secondary | ICD-10-CM | POA: Diagnosis present

## 2019-10-08 ENCOUNTER — Other Ambulatory Visit (HOSPITAL_BASED_OUTPATIENT_CLINIC_OR_DEPARTMENT_OTHER): Payer: Self-pay | Admitting: Family Medicine

## 2019-10-08 DIAGNOSIS — Z720 Tobacco use: Secondary | ICD-10-CM

## 2019-10-08 DIAGNOSIS — Z1231 Encounter for screening mammogram for malignant neoplasm of breast: Secondary | ICD-10-CM

## 2019-10-09 LAB — CYTOLOGY - PAP
Comment: NEGATIVE
Diagnosis: NEGATIVE
High risk HPV: NEGATIVE

## 2019-11-13 ENCOUNTER — Ambulatory Visit (HOSPITAL_BASED_OUTPATIENT_CLINIC_OR_DEPARTMENT_OTHER)
Admission: RE | Admit: 2019-11-13 | Discharge: 2019-11-13 | Disposition: A | Discharge status: Home / Self Care | Payer: 59 | Source: Ambulatory Visit | Attending: Family Medicine | Admitting: Family Medicine

## 2019-11-13 ENCOUNTER — Other Ambulatory Visit: Payer: Self-pay

## 2019-11-13 DIAGNOSIS — Z1231 Encounter for screening mammogram for malignant neoplasm of breast: Secondary | ICD-10-CM | POA: Diagnosis present

## 2019-11-13 DIAGNOSIS — Z720 Tobacco use: Secondary | ICD-10-CM | POA: Insufficient documentation

## 2019-11-14 ENCOUNTER — Other Ambulatory Visit: Payer: Self-pay | Admitting: Family Medicine

## 2019-11-14 DIAGNOSIS — R928 Other abnormal and inconclusive findings on diagnostic imaging of breast: Secondary | ICD-10-CM

## 2019-11-28 ENCOUNTER — Other Ambulatory Visit: Payer: Self-pay

## 2019-11-28 ENCOUNTER — Other Ambulatory Visit: Payer: Self-pay | Admitting: Family Medicine

## 2019-11-28 ENCOUNTER — Ambulatory Visit
Admission: RE | Admit: 2019-11-28 | Discharge: 2019-11-28 | Disposition: A | Discharge status: Home / Self Care | Payer: 59 | Source: Ambulatory Visit | Attending: Family Medicine | Admitting: Family Medicine

## 2019-11-28 DIAGNOSIS — N63 Unspecified lump in unspecified breast: Secondary | ICD-10-CM

## 2019-11-28 DIAGNOSIS — R928 Other abnormal and inconclusive findings on diagnostic imaging of breast: Secondary | ICD-10-CM

## 2020-06-01 ENCOUNTER — Other Ambulatory Visit: Payer: 59

## 2020-06-04 IMAGING — MG DIGITAL SCREENING BILAT W/ TOMO W/ CAD
8 series · 9 of 24 positions shown · non-contrast
Comparison: None.

CLINICAL DATA: Screening.

EXAM:
DIGITAL SCREENING BILATERAL MAMMOGRAM WITH TOMO AND CAD

[L CC synth-2D]
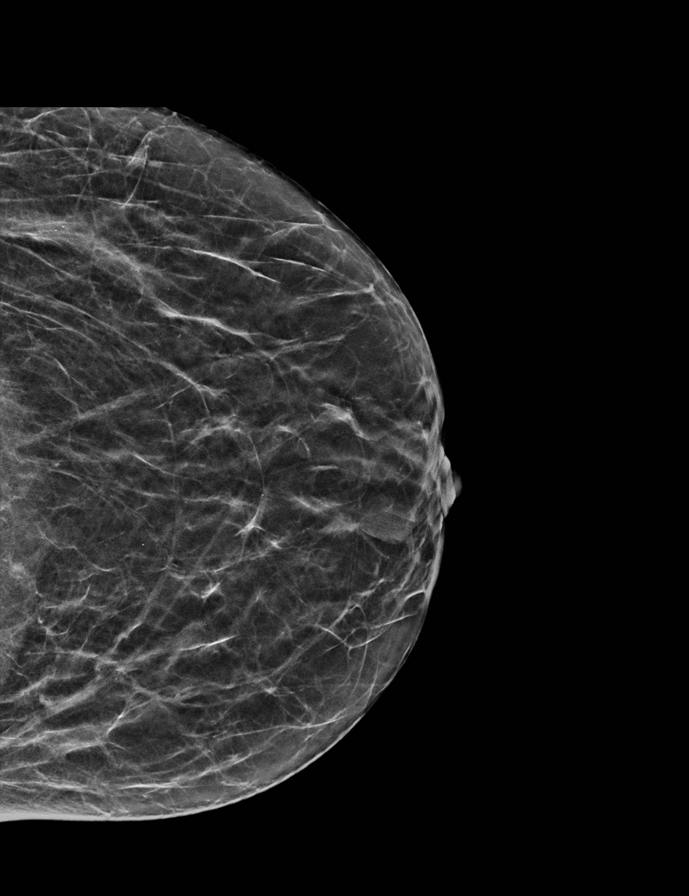

[L MLO synth-2D]
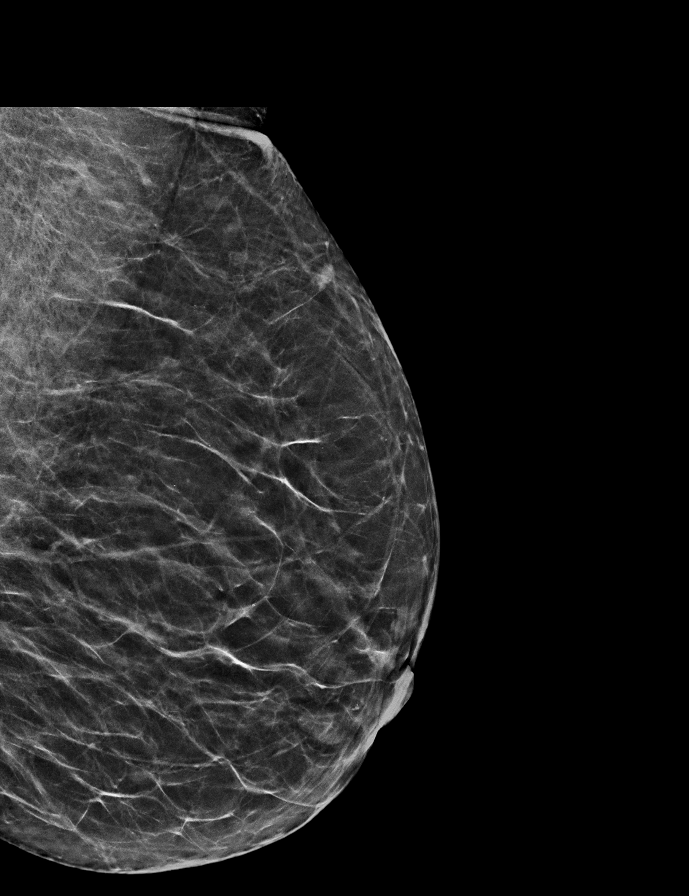

[R MLO synth-2D]
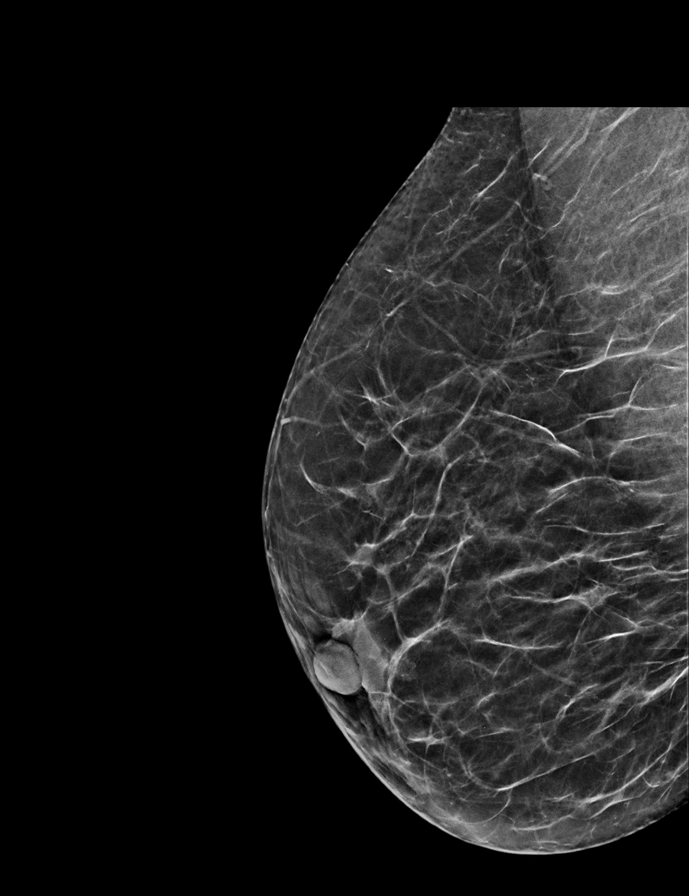

[R CC synth-2D]
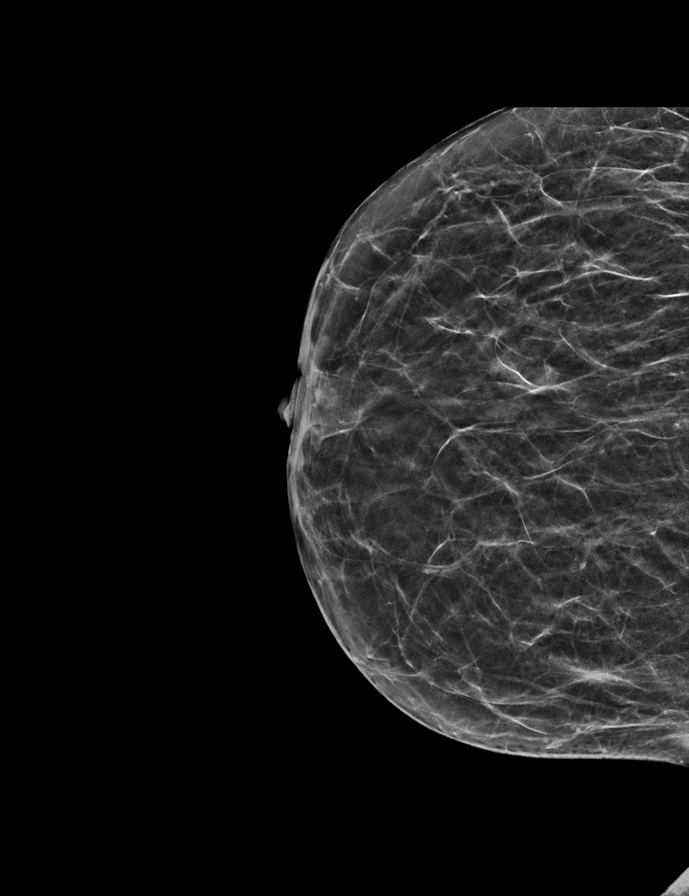

[L CC tomo · 2 of 45 frames shown]
[frame 15/45]
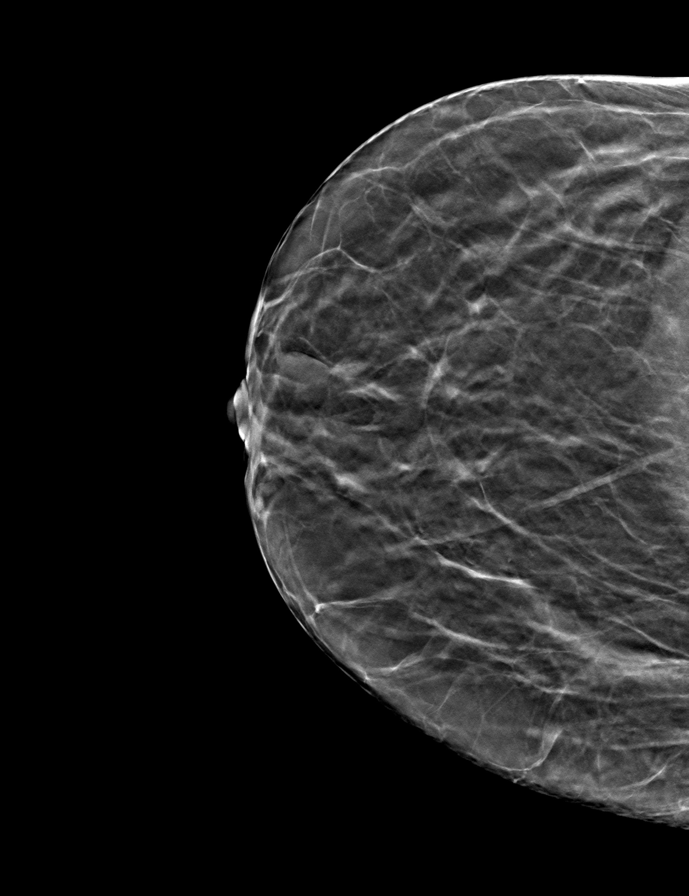
[frame 23/45]
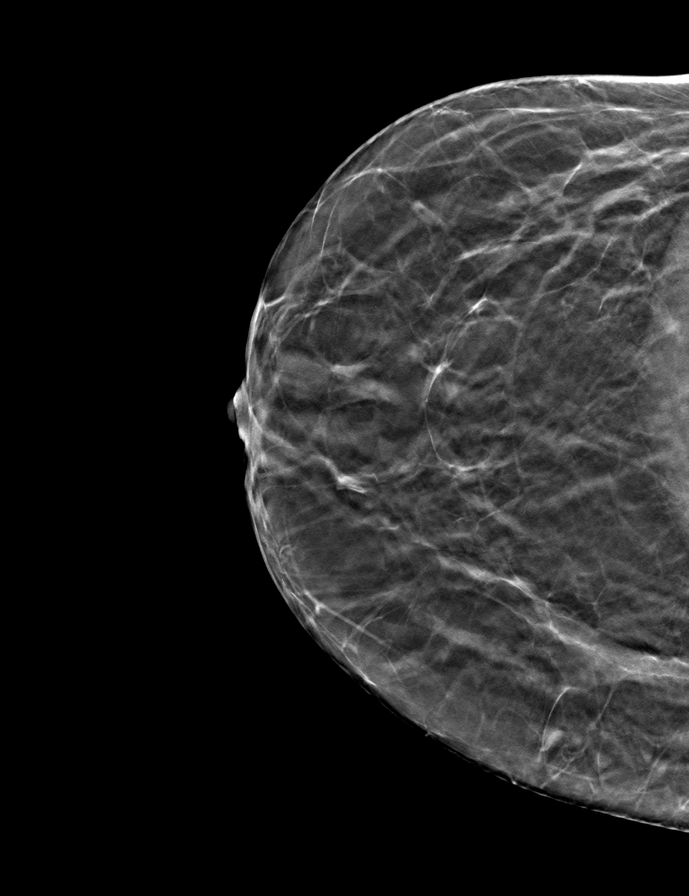

[R CC tomo · tomo slice 22/43.0]
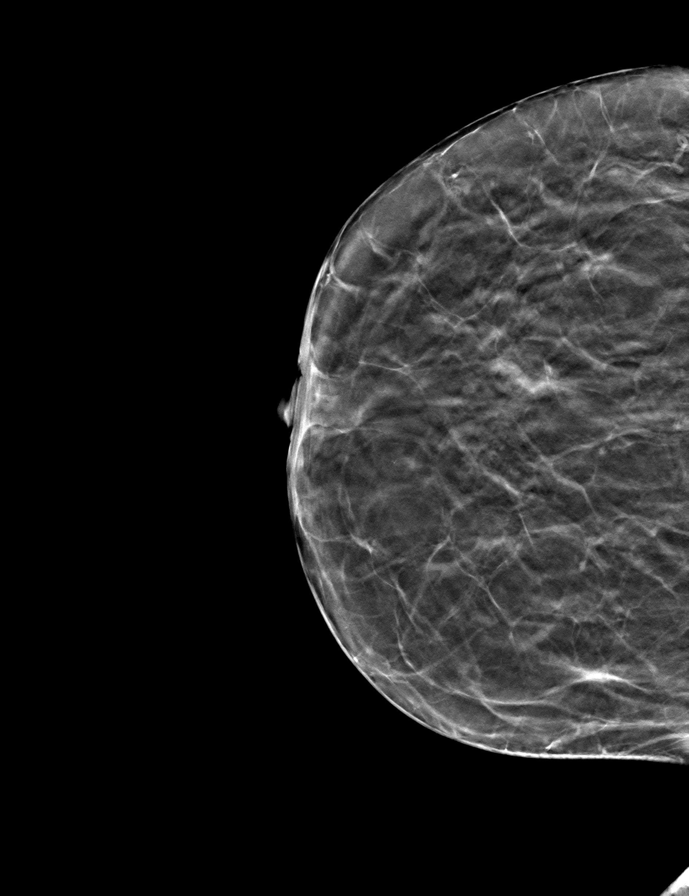

[R MLO tomo · tomo slice 24/47.0]
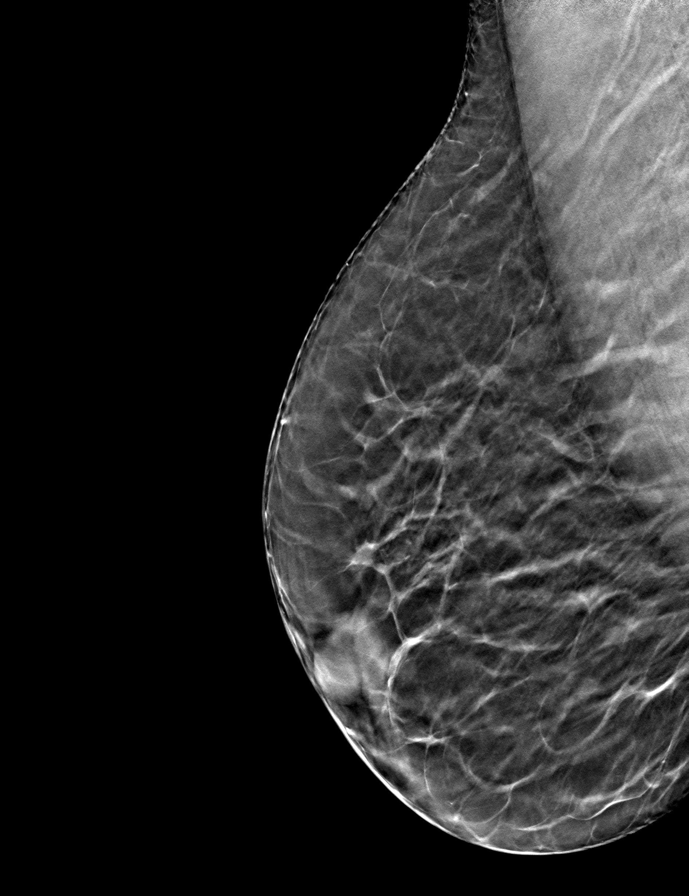

[L MLO tomo · tomo slice 24/47.0]
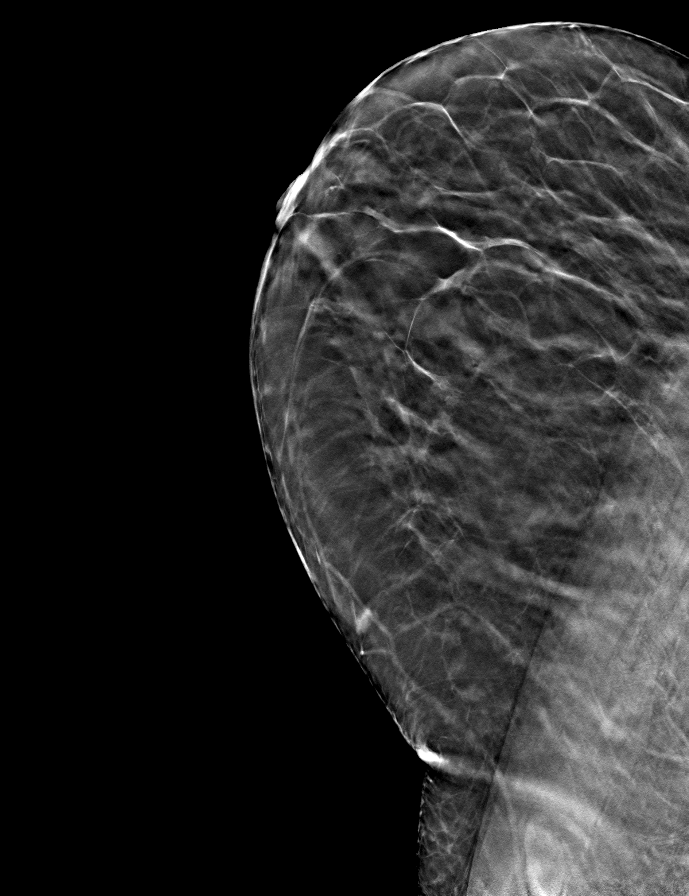

[9 of 24 positions shown; findings below may reference images not displayed]

ACR Breast Density Category b: There are scattered areas of
fibroglandular density.
FINDINGS: In the right breast a possible mass requires further evaluation.

In the left breast a possible mass requires further evaluation.

Images were processed with CAD.
IMPRESSION: Further evaluation is suggested for a possible mass in the right
breast.

Further evaluation is suggested for a possible mass in the left
breast.

RECOMMENDATION:
Diagnostic mammogram and possibly ultrasound of both breasts.
(Code:[id number])

The patient will be contacted regarding the findings, and additional
imaging will be scheduled.

BI-RADS CATEGORY  0: Incomplete. Need additional imaging evaluation
and/or prior mammograms for comparison.

## 2021-10-11 DIAGNOSIS — Z1231 Encounter for screening mammogram for malignant neoplasm of breast: Secondary | ICD-10-CM | POA: Diagnosis not present

## 2021-10-11 DIAGNOSIS — F419 Anxiety disorder, unspecified: Secondary | ICD-10-CM | POA: Diagnosis not present

## 2021-10-11 DIAGNOSIS — Z0000 Encounter for general adult medical examination without abnormal findings: Secondary | ICD-10-CM | POA: Diagnosis not present

## 2021-10-11 DIAGNOSIS — G43909 Migraine, unspecified, not intractable, without status migrainosus: Secondary | ICD-10-CM | POA: Diagnosis not present

## 2021-10-11 DIAGNOSIS — R69 Illness, unspecified: Secondary | ICD-10-CM | POA: Diagnosis not present

## 2021-10-11 DIAGNOSIS — Z1211 Encounter for screening for malignant neoplasm of colon: Secondary | ICD-10-CM | POA: Diagnosis not present

## 2021-10-11 DIAGNOSIS — Z13220 Encounter for screening for lipoid disorders: Secondary | ICD-10-CM | POA: Diagnosis not present

## 2021-10-12 ENCOUNTER — Other Ambulatory Visit (HOSPITAL_BASED_OUTPATIENT_CLINIC_OR_DEPARTMENT_OTHER): Payer: Self-pay

## 2021-10-12 MED ORDER — GABAPENTIN 100 MG PO CAPS
ORAL_CAPSULE | ORAL | 0 refills | Status: AC
  Filled 2021-10-12: qty 30, 30d supply, fill #0

## 2021-10-13 ENCOUNTER — Other Ambulatory Visit (HOSPITAL_BASED_OUTPATIENT_CLINIC_OR_DEPARTMENT_OTHER): Payer: Self-pay

## 2021-10-14 ENCOUNTER — Other Ambulatory Visit: Payer: Self-pay | Admitting: Family Medicine

## 2021-10-14 DIAGNOSIS — N63 Unspecified lump in unspecified breast: Secondary | ICD-10-CM

## 2021-10-14 DIAGNOSIS — Z09 Encounter for follow-up examination after completed treatment for conditions other than malignant neoplasm: Secondary | ICD-10-CM

## 2021-11-02 ENCOUNTER — Other Ambulatory Visit (HOSPITAL_BASED_OUTPATIENT_CLINIC_OR_DEPARTMENT_OTHER): Payer: Self-pay

## 2021-11-02 MED ORDER — FLUCONAZOLE 150 MG PO TABS
ORAL_TABLET | ORAL | 0 refills | Status: AC
Start: 2021-11-02 — End: ?
  Filled 2021-11-02: qty 2, 2d supply, fill #0

## 2021-11-09 ENCOUNTER — Other Ambulatory Visit (HOSPITAL_BASED_OUTPATIENT_CLINIC_OR_DEPARTMENT_OTHER): Payer: Self-pay

## 2021-11-09 MED ORDER — GABAPENTIN 100 MG PO CAPS
ORAL_CAPSULE | ORAL | 0 refills | Status: AC
  Filled 2021-11-09: qty 30, 30d supply, fill #0
  Filled 2021-12-14: qty 30, 30d supply, fill #1
  Filled 2022-01-10: qty 30, 30d supply, fill #2

## 2021-11-10 ENCOUNTER — Other Ambulatory Visit: Payer: Self-pay

## 2021-11-17 ENCOUNTER — Ambulatory Visit
Admission: RE | Admit: 2021-11-17 | Discharge: 2021-11-17 | Disposition: A | Discharge status: Home / Self Care | Payer: Self-pay | Source: Ambulatory Visit | Attending: Family Medicine | Admitting: Family Medicine

## 2021-11-17 DIAGNOSIS — Z09 Encounter for follow-up examination after completed treatment for conditions other than malignant neoplasm: Secondary | ICD-10-CM

## 2021-11-17 DIAGNOSIS — N63 Unspecified lump in unspecified breast: Secondary | ICD-10-CM

## 2021-11-17 DIAGNOSIS — R922 Inconclusive mammogram: Secondary | ICD-10-CM | POA: Diagnosis not present

## 2021-12-14 ENCOUNTER — Other Ambulatory Visit (HOSPITAL_BASED_OUTPATIENT_CLINIC_OR_DEPARTMENT_OTHER): Payer: Self-pay

## 2022-01-11 ENCOUNTER — Other Ambulatory Visit (HOSPITAL_BASED_OUTPATIENT_CLINIC_OR_DEPARTMENT_OTHER): Payer: Self-pay

## 2022-02-12 ENCOUNTER — Other Ambulatory Visit (HOSPITAL_BASED_OUTPATIENT_CLINIC_OR_DEPARTMENT_OTHER): Payer: Self-pay

## 2022-02-14 ENCOUNTER — Other Ambulatory Visit (HOSPITAL_BASED_OUTPATIENT_CLINIC_OR_DEPARTMENT_OTHER): Payer: Self-pay

## 2022-02-16 ENCOUNTER — Other Ambulatory Visit (HOSPITAL_BASED_OUTPATIENT_CLINIC_OR_DEPARTMENT_OTHER): Payer: Self-pay

## 2022-02-16 MED ORDER — GABAPENTIN 100 MG PO CAPS
ORAL_CAPSULE | ORAL | 0 refills | Status: DC
  Filled 2022-02-16: qty 30, 30d supply, fill #0
  Filled 2022-03-15: qty 30, 30d supply, fill #1
  Filled 2022-04-15: qty 30, 30d supply, fill #2

## 2022-03-15 ENCOUNTER — Other Ambulatory Visit (HOSPITAL_BASED_OUTPATIENT_CLINIC_OR_DEPARTMENT_OTHER): Payer: Self-pay

## 2022-04-15 ENCOUNTER — Other Ambulatory Visit (HOSPITAL_BASED_OUTPATIENT_CLINIC_OR_DEPARTMENT_OTHER): Payer: Self-pay

## 2022-05-18 ENCOUNTER — Other Ambulatory Visit (HOSPITAL_BASED_OUTPATIENT_CLINIC_OR_DEPARTMENT_OTHER): Payer: Self-pay

## 2022-05-18 MED ORDER — GABAPENTIN 100 MG PO CAPS
100.0000 mg | ORAL_CAPSULE | Freq: Every day | ORAL | 1 refills | Status: DC
  Filled 2022-05-18: qty 30, 30d supply, fill #0
  Filled 2022-06-16: qty 30, 30d supply, fill #1
  Filled 2022-07-18: qty 30, 30d supply, fill #2
  Filled 2022-08-16: qty 30, 30d supply, fill #3
  Filled 2022-09-16: qty 30, 30d supply, fill #4
  Filled 2022-10-26: qty 30, 30d supply, fill #5

## 2022-06-16 ENCOUNTER — Other Ambulatory Visit (HOSPITAL_BASED_OUTPATIENT_CLINIC_OR_DEPARTMENT_OTHER): Payer: Self-pay

## 2022-07-18 ENCOUNTER — Other Ambulatory Visit (HOSPITAL_BASED_OUTPATIENT_CLINIC_OR_DEPARTMENT_OTHER): Payer: Self-pay

## 2022-10-14 DIAGNOSIS — F5101 Primary insomnia: Secondary | ICD-10-CM | POA: Diagnosis not present

## 2022-10-14 DIAGNOSIS — Z0000 Encounter for general adult medical examination without abnormal findings: Secondary | ICD-10-CM | POA: Diagnosis not present

## 2022-10-14 DIAGNOSIS — G43909 Migraine, unspecified, not intractable, without status migrainosus: Secondary | ICD-10-CM | POA: Diagnosis not present

## 2022-10-14 DIAGNOSIS — Z1211 Encounter for screening for malignant neoplasm of colon: Secondary | ICD-10-CM | POA: Diagnosis not present

## 2022-10-14 DIAGNOSIS — Z6822 Body mass index (BMI) 22.0-22.9, adult: Secondary | ICD-10-CM | POA: Diagnosis not present

## 2022-10-25 DIAGNOSIS — Z1211 Encounter for screening for malignant neoplasm of colon: Secondary | ICD-10-CM | POA: Diagnosis not present

## 2022-11-30 ENCOUNTER — Other Ambulatory Visit (HOSPITAL_BASED_OUTPATIENT_CLINIC_OR_DEPARTMENT_OTHER): Payer: Self-pay

## 2022-11-30 MED ORDER — GABAPENTIN 100 MG PO CAPS
100.0000 mg | ORAL_CAPSULE | Freq: Every day | ORAL | 3 refills | Status: AC
  Filled 2022-11-30: qty 30, 30d supply, fill #0
  Filled 2023-01-16: qty 30, 30d supply, fill #1
  Filled 2023-10-16: qty 30, 30d supply, fill #2

## 2023-02-24 DIAGNOSIS — H1132 Conjunctival hemorrhage, left eye: Secondary | ICD-10-CM | POA: Diagnosis not present

## 2023-10-16 ENCOUNTER — Other Ambulatory Visit: Payer: Self-pay

## 2023-10-16 ENCOUNTER — Other Ambulatory Visit (HOSPITAL_BASED_OUTPATIENT_CLINIC_OR_DEPARTMENT_OTHER): Payer: Self-pay

## 2023-10-16 MED ORDER — FLUCONAZOLE 150 MG PO TABS
150.0000 mg | ORAL_TABLET | Freq: Once | ORAL | 0 refills | Status: AC
  Filled 2023-10-16: qty 1, 1d supply, fill #0

## 2023-10-19 ENCOUNTER — Other Ambulatory Visit: Payer: Self-pay
